# Patient Record
Sex: Female | Born: 1945 | Race: White | Hispanic: No | Marital: Married | Smoking: Current some day smoker
Health system: Southern US, Community
[De-identification: ages and names within clinical notes are randomized; demographics above are authoritative.]

## PROBLEM LIST (undated history)

## (undated) DIAGNOSIS — I10 Essential (primary) hypertension: Secondary | ICD-10-CM

---

## 2010-05-09 ENCOUNTER — Inpatient Hospital Stay (HOSPITAL_COMMUNITY): Admission: RE | Admit: 2010-05-09 | Discharge: 2010-05-10 | Payer: Self-pay | Admitting: Cardiovascular Disease

## 2010-12-31 LAB — BASIC METABOLIC PANEL
CO2: 28 mEq/L (ref 19–32)
CO2: 28 mEq/L (ref 19–32)
Chloride: 103 mEq/L (ref 96–112)
GFR calc Af Amer: >60 mL/min (ref >60)
Glucose, Bld: 89 mg/dL (ref 70–99)
Potassium: 3.7 mEq/L (ref 3.5–5.1)
Sodium: 135 mEq/L (ref 135–145)
Sodium: 137 mEq/L (ref 135–145)

## 2011-02-28 NOTE — Procedures (Signed)
NAMEJOSIANE, Adams NO.:  [id number]   MEDICAL RECORD NO.:  [id number]          PATIENT TYPE:  INP   LOCATION:  2807                         FACILITY:  [facility]   PHYSICIAN:  Nanetta Batty, M.D.   DATE OF BIRTH:  04-Aug-1946   DATE OF PROCEDURE:  DATE OF DISCHARGE:                    PERIPHERAL VASCULAR INVASIVE PROCEDURE   Jasmin Adams is a 65 year old Caucasian female, mother of 3,  grandmother of 75 grandchildren, last saw on April 27, 2010, for  cardiovascular evaluation referred by Dr. Dory Peru.  She has a risk factor  positive for 25-50 pack year history of tobacco abuse, smoking 1/2 pack  per day, treated hypertension, dyslipidemia and family history.  The  patient apparently had a heart attack and a stroke in the past and never  had a heart cath.  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  She has had claudication  with abnormal Dopplers and a CT of the lower extremities.  She had a  cardiac catheterization showing that she had minimal CAD.  She presents  now for angiography and potential intervention of lifestyle limiting  claudication.   DESCRIPTION OF PROCEDURE:  The 5-French in the right femoral artery was  used to perform selective angiography of the right innominate and left  subclavian, both of which were normal.  A 5-French pigtail catheter was  then pulled down to the level of the renal artery and abdominal  aortography with bifemoral runoff was performed using bolus chase  digital subtraction sub-table technique.  Visipaque dye was used for the  entirety of the case.  Retrograde aortic pressure was monitored during  the case.   ANGIOGRAPHIC RESULTS:  1. Right innominate widely patent.  2. Left subclavian widely patent.  3. Abdominal aorta.      a.     Renal arteries - normal.      b.     Infrarenal abdominal aorta - normal.  4. Left lower extremity;      a.     40%-50% segmental proximal  left common iliac artery       stenosis.      b.     40% segmental proximal left external iliac artery stenosis.      c.     90% mid focal left SFA stenosis with three-vessel runoff.  5. Right lower extremity;      a.     30% proximal right iliac artery stenosis.      b.     normal right SFA with three-vessel runoff.   PROCEDURE DESCRIPTION:  Contralateral access was obtained with a short 5-  [place] crossover catheter, angled glidewire, Wholey wire and 6-French  crossover sheath.  The patient received 3000 units of heparin  intravenously.  Visipaque dye was used for the entirety of the case.  Retrograde aortic pressure was monitored during the case.  An 0.25  Wholey wire was then advanced across the SFA stenosis and predilatation  was performed with a 4.2 balloon.  Stenting was performed with a 6 x 4  Protege EV3 nitinol self-expanding stent and postdilatation with a 5.3  balloon resulting  in reduction of a 90% mid left SFA stenosis to 0%  residual with excellent runoff.  The patient tolerated the procedure  well.  There were no hemodynamic electrocardiographic sequelae.   IMPRESSION:  Successful PTA and stenting of the left SFA for lifestyle  limiting claudication.  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  Follow up Dopplers will be obtained in the office after discharge  and the patient will be followed clinically.  If her iliac lesions  progress, she will be a candidate for stenting via the ipsilateral  approach.   The ACT was measured and the sheath was removed.  Pressure was held in  the groin to achieve hemostasis.  The patient left the lab in stable  condition.  She will be hydrated overnight, treated with aspirin and  Plavix, discharged home in the morning.  We will obtain follow up  Dopplers after which she will see me back in the office.  Dr. Dory Peru was  notified of these results.       Nanetta Batty, M.D.     JB/MEDQ  D:  05/09/2010  T:  05/09/2010  Job:  [id number]   cc:   [facility]  [facility]   Electronically Signed by Nanetta Batty M.D. on 06/06/2010 10:18:59 AM

## 2015-06-18 ENCOUNTER — Emergency Department (HOSPITAL_COMMUNITY): Payer: Medicare HMO

## 2015-06-18 ENCOUNTER — Encounter (HOSPITAL_COMMUNITY): Payer: Self-pay | Admitting: Emergency Medicine

## 2015-06-18 ENCOUNTER — Emergency Department (HOSPITAL_COMMUNITY)
Admission: EM | Admit: 2015-06-18 | Discharge: 2015-06-18 | Disposition: A | Discharge status: Home / Self Care | Payer: Medicare HMO | Attending: Emergency Medicine | Admitting: Emergency Medicine

## 2015-06-18 DIAGNOSIS — Z720 Tobacco use: Secondary | ICD-10-CM | POA: Diagnosis not present

## 2015-06-18 DIAGNOSIS — Y9389 Activity, other specified: Secondary | ICD-10-CM | POA: Diagnosis not present

## 2015-06-18 DIAGNOSIS — S4992XA Unspecified injury of left shoulder and upper arm, initial encounter: Secondary | ICD-10-CM | POA: Diagnosis present

## 2015-06-18 DIAGNOSIS — S42002A Fracture of unspecified part of left clavicle, initial encounter for closed fracture: Secondary | ICD-10-CM

## 2015-06-18 DIAGNOSIS — W07XXXA Fall from chair, initial encounter: Secondary | ICD-10-CM | POA: Insufficient documentation

## 2015-06-18 DIAGNOSIS — Y998 Other external cause status: Secondary | ICD-10-CM | POA: Insufficient documentation

## 2015-06-18 DIAGNOSIS — S42032A Displaced fracture of lateral end of left clavicle, initial encounter for closed fracture: Secondary | ICD-10-CM | POA: Diagnosis not present

## 2015-06-18 DIAGNOSIS — I10 Essential (primary) hypertension: Secondary | ICD-10-CM | POA: Insufficient documentation

## 2015-06-18 DIAGNOSIS — Y9289 Other specified places as the place of occurrence of the external cause: Secondary | ICD-10-CM | POA: Insufficient documentation

## 2015-06-18 DIAGNOSIS — S0990XA Unspecified injury of head, initial encounter: Secondary | ICD-10-CM | POA: Diagnosis not present

## 2015-06-18 HISTORY — DX: Essential (primary) hypertension: I10

## 2015-06-18 MED ORDER — DIAZEPAM 2 MG PO TABS
2.0000 mg | ORAL_TABLET | Freq: Once | ORAL | Status: AC
  Administered 2015-06-18: 2 mg via ORAL
  Filled 2015-06-18: qty 1

## 2015-06-18 MED ORDER — ACETAMINOPHEN 325 MG PO TABS
650.0000 mg | ORAL_TABLET | Freq: Once | ORAL | Status: AC
  Administered 2015-06-18: 650 mg via ORAL
  Filled 2015-06-18: qty 2

## 2015-06-18 MED ORDER — OXYCODONE HCL 5 MG PO TABS
5.0000 mg | ORAL_TABLET | Freq: Once | ORAL | Status: AC
  Administered 2015-06-18: 5 mg via ORAL
  Filled 2015-06-18: qty 1

## 2015-06-18 MED ORDER — OXYCODONE HCL 5 MG PO TABS: 2.5000 mg | ORAL_TABLET | ORAL | Status: AC | PRN

## 2015-06-18 MED ORDER — IBUPROFEN 800 MG PO TABS
800.0000 mg | ORAL_TABLET | Freq: Once | ORAL | Status: AC
  Administered 2015-06-18: 800 mg via ORAL
  Filled 2015-06-18: qty 1

## 2015-06-18 MED ORDER — MORPHINE SULFATE (PF) 4 MG/ML IV SOLN
4.0000 mg | Freq: Once | INTRAVENOUS | Status: AC
  Administered 2015-06-18: 4 mg via INTRAMUSCULAR
  Filled 2015-06-18: qty 1

## 2015-06-18 NOTE — ED Provider Notes (Signed)
CSN: [id number]     Arrival date & time 06/18/15  0701 History   First MD Initiated Contact with Patient 06/18/15 [id number]     Chief Complaint  Patient presents with  . Fall  . Shoulder Pain     (Consider location/radiation/quality/duration/timing/severity/associated sxs/prior Treatment) Patient is a 69 y.o. female presenting with shoulder pain. The history is provided by the patient.  Shoulder Pain Location:  Shoulder Time since incident:  12 hours Injury: yes   Mechanism of injury: fall   Fall:    Fall occurred: from a chair.   Impact surface:  Hard floor   Point of impact:  Head Shoulder location:  L shoulder Pain details:    Quality:  Aching   Radiates to:  Does not radiate   Severity:  No pain   Onset quality:  Sudden   Duration:  8 hours   Timing:  Constant   Progression:  Worsening Chronicity:  New Prior injury to area:  No Relieved by:  Nothing Worsened by:  Movement, stress and bearing weight Ineffective treatments:  None tried Associated symptoms: decreased range of motion   Associated symptoms: no fever, no numbness and no stiffness    69 yo F with a chief complaint of L shoulder pain.  Patient fell last night when she fell asleep in a chair. Fell onto left shoulder. Hit head pretty hard on the ground.  Patient denies LOC, neck pain, back pain, chest pain, sob.  Patient with slowly worsening pain since onset.  Denies other injury.   Past Medical History  Diagnosis Date  . Hypertension    History reviewed. No pertinent past surgical history. History reviewed. No pertinent family history. Social History  Substance Use Topics  . Smoking status: Current Some Day Smoker    Types: Cigarettes  . Smokeless tobacco: None  . Alcohol Use: No   OB History    No data available     Review of Systems  Constitutional: Negative for fever and chills.  HENT: Negative for congestion and rhinorrhea.   Eyes: Negative for redness and visual disturbance.  Respiratory:  Negative for shortness of breath and wheezing.   Cardiovascular: Negative for chest pain and palpitations.  Gastrointestinal: Negative for nausea and vomiting.  Genitourinary: Negative for dysuria and urgency.  Musculoskeletal: Positive for myalgias and arthralgias. Negative for stiffness.  Skin: Negative for pallor and wound.  Neurological: Negative for dizziness and headaches.      Allergies  Codeine  Home Medications   Prior to Admission medications   Medication Sig Start Date End Date Taking? Authorizing Provider  oxyCODONE (ROXICODONE) 5 MG immediate release tablet Take 0.5 tablets (2.5 mg total) by mouth every 4 (four) hours as needed for severe pain. 06/18/15   Melene Plan, DO   BP 145/80 mmHg  Pulse 75  Temp(Src) 98.1 F (36.7 C) (Oral)  Resp 15  SpO2 95% Physical Exam  Constitutional: She is oriented to person, place, and time. She appears well-developed and well-nourished. No distress.  HENT:  Head: Normocephalic and atraumatic.  Eyes: EOM are normal. Pupils are equal, round, and reactive to light.  Neck: Normal range of motion. Neck supple.  Cardiovascular: Normal rate and regular rhythm.  Exam reveals no gallop and no friction rub.   No murmur heard. Pulmonary/Chest: Effort normal. She has no wheezes. She has no rales.  Abdominal: Soft. She exhibits no distension. There is no tenderness. There is no rebound and no guarding.  Musculoskeletal: She exhibits tenderness. She exhibits  no edema.  TTP worst in the muscle belly of the left trapezius. Patient also with some pain to the proximal humerus. No noted crepitus. Pulse motor and sensation intact distally.  Neurological: She is alert and oriented to person, place, and time.  Skin: Skin is warm and dry. She is not diaphoretic.  Psychiatric: She has a normal mood and affect. Her behavior is normal.    ED Course  Procedures (including critical care time) Labs Review Labs Reviewed - No data to display  Imaging  Review Ct Head Wo Contrast  06/18/2015   CLINICAL DATA:  Status post fall, with trauma to the left side of the head. Patient complains of left-sided neck pain.  EXAM: CT HEAD WITHOUT CONTRAST  CT CERVICAL SPINE WITHOUT CONTRAST  TECHNIQUE: Multidetector CT imaging of the head and cervical spine was performed following the standard protocol without intravenous contrast. Multiplanar CT image reconstructions of the cervical spine were also generated.  COMPARISON:  05/30/2011  FINDINGS: CT HEAD FINDINGS  No mass effect or midline shift. No evidence of acute intracranial hemorrhage, or infarction. No abnormal extra-axial fluid collections. Gray-white matter differentiation is normal. Basal cisterns are preserved. There is mild brain parenchymal atrophy and chronic small vessel disease changes.  No depressed skull fractures. Visualized paranasal sinuses and mastoid air cells are not opacified.  CT CERVICAL SPINE FINDINGS  Emphysematous changes of the lung AP cysts are seen.  There is straightening of the cervical lordosis. Multilevel osteoarthritic changes, worse at C5-C6 are seen with disc space narrowing, vertebral body remodeling, and osteophyte formation. Posterior facet arthropathy is also present. There is no evidence for acute fracture or dislocation. Prevertebral soft tissues have a normal appearance.  IMPRESSION: No evidence of acute traumatic injury to the head or cervical spine.  Mild periventricular white matter small vessel disease changes.  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   Electronically Signed   By: Ted Mcalpine M.D.   On: 06/18/2015 08:22   Ct Cervical Spine Wo Contrast  06/18/2015   CLINICAL DATA:  Status post fall, with trauma to the left side of the head. Patient complains of left-sided neck pain.  EXAM: CT HEAD WITHOUT CONTRAST  CT CERVICAL SPINE WITHOUT CONTRAST  TECHNIQUE: Multidetector CT imaging of the head and cervical spine was  performed following the standard protocol without intravenous contrast. Multiplanar CT image reconstructions of the cervical spine were also generated.  COMPARISON:  05/30/2011  FINDINGS: CT HEAD FINDINGS  No mass effect or midline shift. No evidence of acute intracranial hemorrhage, or infarction. No abnormal extra-axial fluid collections. Gray-white matter differentiation is normal. Basal cisterns are preserved. There is mild brain parenchymal atrophy and chronic small vessel disease changes.  No depressed skull fractures. Visualized paranasal sinuses and mastoid air cells are not opacified.  CT CERVICAL SPINE FINDINGS  Emphysematous changes of the lung AP cysts are seen.  There is straightening of the cervical lordosis. Multilevel osteoarthritic changes, worse at C5-C6 are seen with disc space narrowing, vertebral body remodeling, and osteophyte formation. Posterior facet arthropathy is also present. There is no evidence for acute fracture or dislocation. Prevertebral soft tissues have a normal appearance.  IMPRESSION: No evidence of acute traumatic injury to the head or cervical spine.  Mild periventricular white matter small vessel disease changes.  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   Electronically Signed   By: Ted Mcalpine M.D.   On: 06/18/2015  08:22   Dg Shoulder Left  06/18/2015   CLINICAL DATA:  Pain following fall  EXAM: LEFT SHOULDER - 2+ VIEW  COMPARISON:  None.  FINDINGS: Frontal and Y scapular images were obtained. There is a comminuted fracture of the lateral clavicle with mild superior displacement of the distal fracture fragment. No other fractures. No dislocation. Joint spaces appear intact. No erosive change.  IMPRESSION: Comminuted fracture lateral left clavicle.  No dislocation.   Electronically Signed   By: Bretta Bang III M.D.   On: 06/18/2015 07:57   Dg Humerus Left  06/18/2015   CLINICAL DATA:  Pain following fall   EXAM: LEFT HUMERUS - 2+ VIEW  COMPARISON:  None.  FINDINGS: Frontal and lateral views obtained. There is a comminuted fracture of the lateral left clavicle with mild superior displacement of the distal fracture fragment. No humeral fracture appreciable. No dislocation. There is slight osteoarthritic change in the elbow joint region. Visualized left lung clear.  IMPRESSION: Comminuted fracture lateral left clavicle. Humerus appears intact without fracture or dislocation. Mild osteoarthritic change left elbow joint.   Electronically Signed   By: Bretta Bang III M.D.   On: 06/18/2015 07:58   I have personally reviewed and evaluated these images and lab results as part of my medical decision-making.   EKG Interpretation None      MDM   Final diagnoses:  Clavicle fracture, left, closed, initial encounter     69 yo F with a fall from a chair.  Likely fell asleep.  Will CT head, c spine due to age.  Xr left shoulder and hum  CT head C-spine negative and reviewed by me. Patient with a left distal clavicle fracture as read by me. Placed into a sling. Ortho follow-up.   I have discussed the diagnosis/risks/treatment options with the patient and family and believe the pt to be eligible for discharge home to follow-up with Ortho. We also discussed returning to the ED immediately if new or worsening sx occur. We discussed the sx which are most concerning (e.g., sudden worsening pain) that necessitate immediate return. Medications administered to the patient during their visit and any new prescriptions provided to the patient are listed below.  Medications given during this visit Medications  ibuprofen (ADVIL,MOTRIN) tablet 800 mg (800 mg Oral Given 06/18/15 0816)  acetaminophen (TYLENOL) tablet 650 mg (650 mg Oral Given 06/18/15 0816)  oxyCODONE (Oxy IR/ROXICODONE) immediate release tablet 5 mg (5 mg Oral Given 06/18/15 0816)  morphine 4 MG/ML injection 4 mg (4 mg Intramuscular Given 06/18/15 0917)   diazepam (VALIUM) tablet 2 mg (2 mg Oral Given 06/18/15 0933)    Discharge Medication List as of 06/18/2015  8:39 AM    START taking these medications   Details  oxyCODONE (ROXICODONE) 5 MG immediate release tablet Take 0.5 tablets (2.5 mg total) by mouth every 4 (four) hours as needed for severe pain., Starting 06/18/2015, Until Discontinued, Print         The patient appears reasonably screen and/or stabilized for discharge and I doubt any other medical condition or other [facility] requiring further screening, evaluation, or treatment in the ED at this time prior to discharge.    Melene Plan, DO 06/18/15 1131

## 2015-06-18 NOTE — ED Notes (Signed)
Rounded on patient; Patient off floor to CT.

## 2015-06-18 NOTE — ED Notes (Signed)
Discussed discharge information, patient denies further needs, departed ambulatory with significant other with no apparent distress.

## 2015-06-18 NOTE — ED Notes (Signed)
Per EMS- (from home) dozed off in chair last night, fell asleep and woke up on the ground. This was last night around 2300. Says she remembers hitting the floor. No LOC. Hit left side of her head. Denies head or neck pain. C/o left shoulder pain. Not on blood thinners. A&Ox4. Ambulatory with assistance. Sling on left arm/shoulder. VS: BP 142/80 HR 83 RR 18 SpO2 97% on RA.

## 2015-06-18 NOTE — Discharge Instructions (Signed)
Clavicle Fracture °A clavicle fracture is a broken collarbone. The collarbone is the long bone that connects your shoulder to your rib cage. A broken collarbone may be treated with a sling, a wrap, or surgery. Treatment depends on whether the broken ends of the bone are out of place or not. °HOME CARE °· Put ice on the injured area: °¨ Put ice in a plastic bag. °¨ Place a towel between your skin and the bag. °¨ Leave the ice on for 20 minutes, 2-3 times a day. °· If you have a wrap or splint: °¨ Wear it all the time, and remove it only to take a bath or shower. °¨ When you bathe or shower, keep your shoulder in the same place as when the sling or wrap is on. °¨ Do not lift your arm. °· If you have a wrap: °¨ Another person must tighten it every day. °¨ It should be tight enough to hold your shoulders back. °¨ Make sure you have enough room to put your pointer finger between your body and the strap. °¨ Loosen the wrap right away if you cannot feel your arm or your hands tingle. °· Only take medicines as told by your doctor. °· Avoid activities that make the injury or pain worse for 4-6 weeks after surgery. °· Keep all follow-up appointments. °GET HELP IF: °· Your medicine is not making you feel less pain. °· Your medicine is not making swelling better. °GET HELP RIGHT AWAY IF:  °· Your cannot feel your arm. °· Your arm is cold. °· Your arm is a lighter color than normal. °MAKE SURE YOU:  °· Understand these instructions. °· Will watch your condition. °· Will get help right away if you are not doing well or get worse. °Document Released: 03/20/2008 Document Revised: 10/07/2013 Document Reviewed: 07/20/2009 °[employer]® Patient Information ©2015 [employer]. This information is not intended to replace advice given to you by your health care provider. Make sure you discuss any questions you have with your health care provider. ° °

## 2015-06-18 NOTE — ED Notes (Signed)
Bed: ZO10 Expected date: 06/18/15 Expected time: 6:35 AM Means of arrival: Ambulance Comments: Fall, shoulder pain

## 2015-10-10 IMAGING — CR DG HUMERUS 2V *L*
2 series · 2 of 2 positions shown · non-contrast
Comparison: None.

CLINICAL DATA: Pain following fall

EXAM:
LEFT HUMERUS - 2+ VIEW

[x humerus ap left]
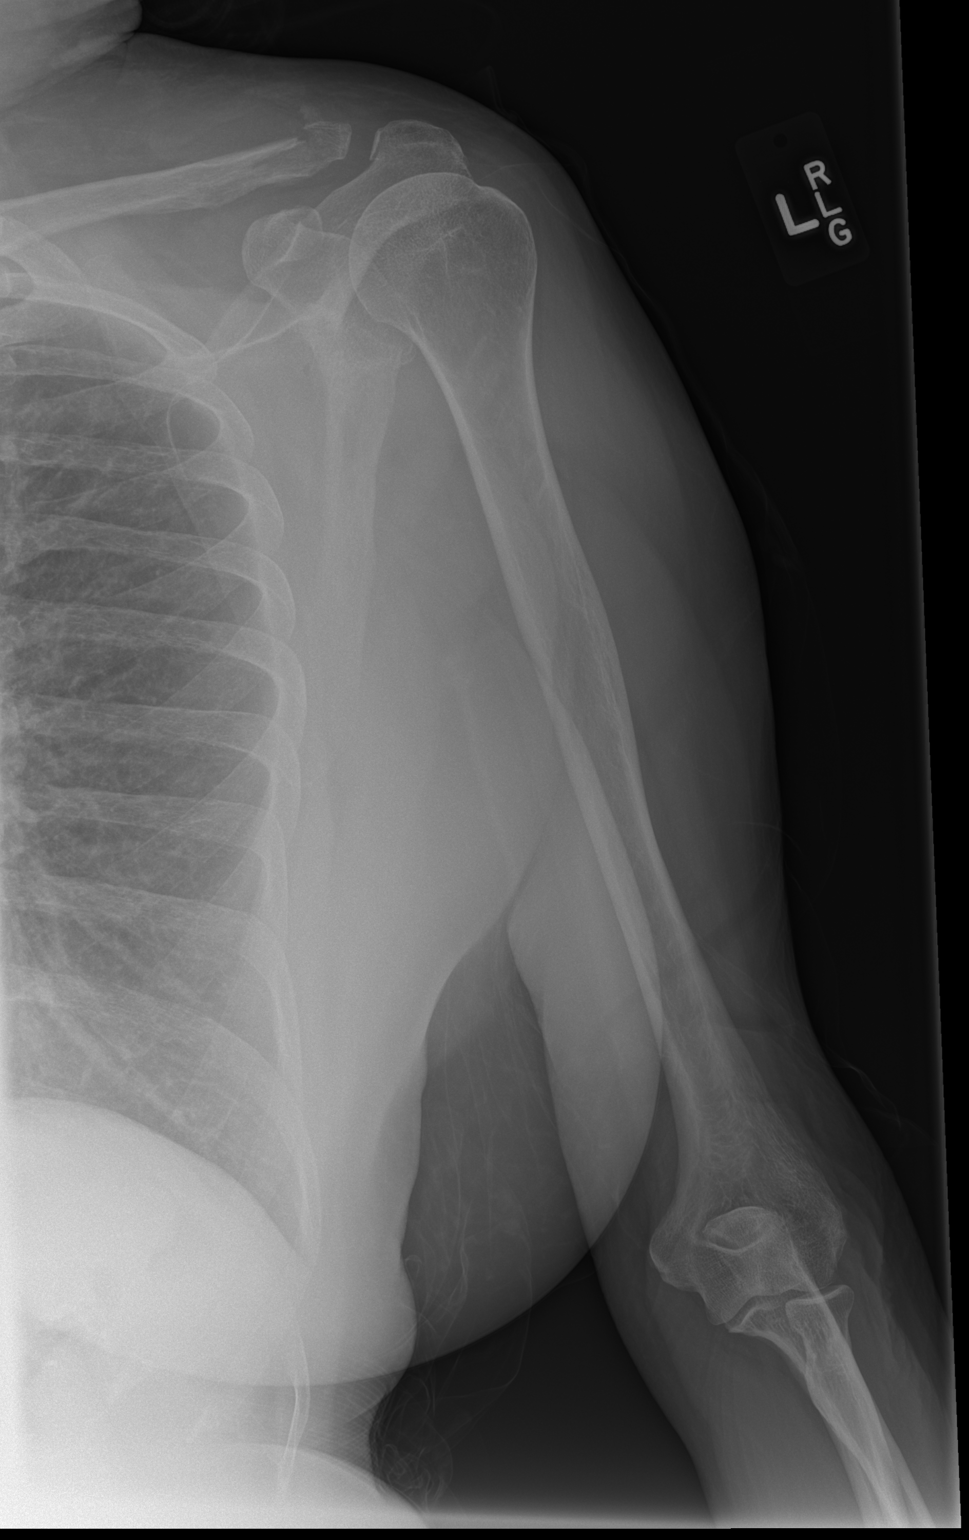

[x humerus lat left]
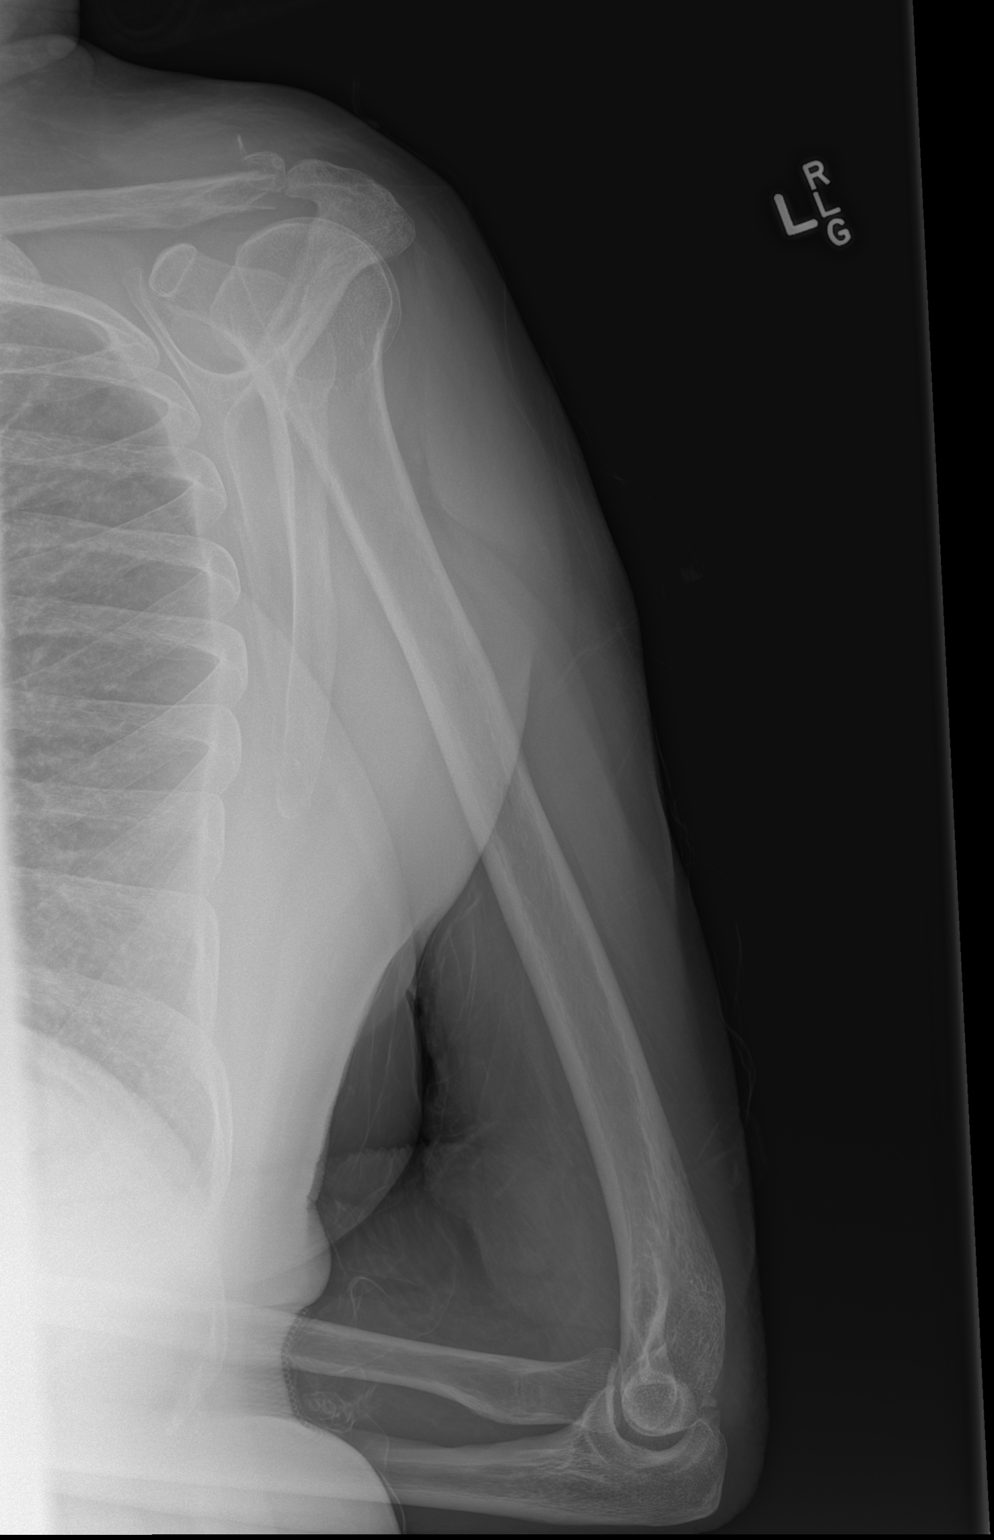

[2 of 2 positions shown; findings below may reference images not displayed]

FINDINGS: Frontal and lateral views obtained. There is a comminuted fracture
of the lateral left clavicle with mild superior displacement of the
distal fracture fragment. No humeral fracture appreciable. No
dislocation. There is slight osteoarthritic change in the elbow
joint region. Visualized left lung clear.
IMPRESSION: Comminuted fracture lateral left clavicle. Humerus appears intact
without fracture or dislocation. Mild osteoarthritic change left
elbow joint.

## 2016-04-15 DEATH — deceased
